# Patient Record
Sex: Male | Born: 1955 | Race: White | Hispanic: No | Marital: Married | Smoking: Never smoker
Health system: Southern US, Community
[De-identification: ages and names within clinical notes are randomized; demographics above are authoritative.]

## PROBLEM LIST (undated history)

## (undated) DIAGNOSIS — K449 Diaphragmatic hernia without obstruction or gangrene: Secondary | ICD-10-CM

## (undated) DIAGNOSIS — C801 Malignant (primary) neoplasm, unspecified: Secondary | ICD-10-CM

## (undated) DIAGNOSIS — G2581 Restless legs syndrome: Secondary | ICD-10-CM

## (undated) DIAGNOSIS — G4700 Insomnia, unspecified: Secondary | ICD-10-CM

## (undated) DIAGNOSIS — K219 Gastro-esophageal reflux disease without esophagitis: Secondary | ICD-10-CM

## (undated) DIAGNOSIS — I10 Essential (primary) hypertension: Secondary | ICD-10-CM

## (undated) DIAGNOSIS — T7840XA Allergy, unspecified, initial encounter: Secondary | ICD-10-CM

## (undated) DIAGNOSIS — E079 Disorder of thyroid, unspecified: Secondary | ICD-10-CM

## (undated) HISTORY — DX: Allergy, unspecified, initial encounter: T78.40XA

## (undated) HISTORY — DX: Essential (primary) hypertension: I10

## (undated) HISTORY — DX: Restless legs syndrome: G25.81

## (undated) HISTORY — DX: Gastro-esophageal reflux disease without esophagitis: K21.9

## (undated) HISTORY — DX: Insomnia, unspecified: G47.00

## (undated) HISTORY — DX: Malignant (primary) neoplasm, unspecified: C80.1

## (undated) HISTORY — PX: OTHER SURGICAL HISTORY: SHX169

## (undated) HISTORY — DX: Diaphragmatic hernia without obstruction or gangrene: K44.9

## (undated) HISTORY — DX: Disorder of thyroid, unspecified: E07.9

---

## 2008-12-10 ENCOUNTER — Ambulatory Visit: Payer: Self-pay | Admitting: Family Medicine

## 2008-12-10 DIAGNOSIS — B181 Chronic viral hepatitis B without delta-agent: Secondary | ICD-10-CM | POA: Insufficient documentation

## 2008-12-10 DIAGNOSIS — I10 Essential (primary) hypertension: Secondary | ICD-10-CM | POA: Insufficient documentation

## 2008-12-10 DIAGNOSIS — E0590 Thyrotoxicosis, unspecified without thyrotoxic crisis or storm: Secondary | ICD-10-CM | POA: Insufficient documentation

## 2008-12-11 ENCOUNTER — Encounter: Payer: Self-pay | Admitting: Family Medicine

## 2008-12-14 LAB — CONVERTED CEMR LAB
AST: 38 units/L — ABNORMAL HIGH (ref 0–37)
Alkaline Phosphatase: 68 units/L (ref 39–117)
BUN: 12 mg/dL (ref 6–23)
Creatinine, Ser: 1.02 mg/dL (ref 0.40–1.50)
Glucose, Bld: 106 mg/dL — ABNORMAL HIGH (ref 70–99)
HCV Ab: NEGATIVE
HDL: 37 mg/dL — ABNORMAL LOW (ref >39)
LDL Cholesterol: 125 mg/dL — ABNORMAL HIGH (ref 0–99)
TSH: 1.385 microintl units/mL (ref 0.350–4.500)
Total Bilirubin: 1.7 mg/dL — ABNORMAL HIGH (ref 0.3–1.2)
Total CHOL/HDL Ratio: 5.1
Triglycerides: 132 mg/dL (ref <150)
VLDL: 26 mg/dL (ref 0–40)

## 2009-02-21 ENCOUNTER — Encounter: Payer: Self-pay | Admitting: Family Medicine

## 2009-02-21 DIAGNOSIS — R7301 Impaired fasting glucose: Secondary | ICD-10-CM | POA: Insufficient documentation

## 2009-02-21 DIAGNOSIS — R748 Abnormal levels of other serum enzymes: Secondary | ICD-10-CM | POA: Insufficient documentation

## 2009-02-22 LAB — CONVERTED CEMR LAB
ALT: 25 units/L (ref 0–53)
AST: 24 units/L (ref 0–37)
Alkaline Phosphatase: 65 units/L (ref 39–117)
Bilirubin, Direct: 0.2 mg/dL (ref 0.0–0.3)
Indirect Bilirubin: 0.9 mg/dL (ref 0.0–0.9)

## 2009-03-11 ENCOUNTER — Ambulatory Visit: Payer: Self-pay | Admitting: Family Medicine

## 2010-04-03 ENCOUNTER — Encounter: Payer: Self-pay | Admitting: Family Medicine

## 2010-04-03 ENCOUNTER — Ambulatory Visit: Payer: Self-pay | Admitting: Family Medicine

## 2010-04-03 DIAGNOSIS — M79609 Pain in unspecified limb: Secondary | ICD-10-CM

## 2010-04-04 DIAGNOSIS — E785 Hyperlipidemia, unspecified: Secondary | ICD-10-CM

## 2010-04-04 LAB — CONVERTED CEMR LAB: Glucose, Bld: 98 mg/dL (ref 70–99)

## 2010-04-06 ENCOUNTER — Encounter: Payer: Self-pay | Admitting: Family Medicine

## 2010-04-07 ENCOUNTER — Telehealth (INDEPENDENT_AMBULATORY_CARE_PROVIDER_SITE_OTHER): Payer: Self-pay | Admitting: *Deleted

## 2010-04-13 ENCOUNTER — Ambulatory Visit: Payer: Self-pay | Admitting: Family Medicine

## 2010-04-13 DIAGNOSIS — B9789 Other viral agents as the cause of diseases classified elsewhere: Secondary | ICD-10-CM | POA: Insufficient documentation

## 2010-05-25 NOTE — Assessment & Plan Note (Signed)
Summary: leg pain/ HTN   Vital Signs:  Patient profile:   55 year old male Height:      74 inches Weight:      233 pounds BMI:     30.02 O2 Sat:      99 % on Room air Pulse rate:   69 / minute BP sitting:   152 / 99  (left arm) Cuff size:   large  Vitals Entered By: [name] CMA (April 03, 2010 10:40 AM)  O2 Flow:  Room air CC: F/U. Med refills. Also c/o achy legs x 1 month   Primary Care Provider:  Seymour Bars DO  CC:  F/U. Med refills. Also c/o achy legs x 1 month.  History of Present Illness: 55 yo WM presents for f/u HTN.  He ran out of atenolol.  He does not have insurance right now.  He has had achey legs and feet x 2 mos.  He had pain at rest this AM and has pain with walking.  He never smoked.  He rarely has numnbess.  His pain has woken him up in the middle of the night.  Usually better with Ibuprofen.  Denies any LBP.  No change in bowel or bladder habits.  Never smoked.      Current Medications (verified): 1)  Atenolol 50 Mg Tabs (Atenolol) .Marland Kitchen.. 1 Tab By Mouth Once Daily 2)  Nasonex 50 Mcg/act Susp (Mometasone Furoate) .... 2 Sprays Per Nostril Daily 3)  Clonazepam 1 Mg Tabs (Clonazepam) .Marland Kitchen.. 1 Tab By Mouth Qhs 4)  Ranitidine Hcl 150 Mg Caps (Ranitidine Hcl) .Marland Kitchen.. 1 Tab By Mouth Bid  Allergies (verified): No Known Drug Allergies  Past History:  Past Medical History: Reviewed history from 12/10/2008 and no changes required. hiatal hernia, gerd insomnia RLS allergies HTN ? 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  Denies ETOH. No exercise.  Review of Systems      See HPI  Physical Exam  General:  alert, well-developed, well-nourished, well-hydrated, and  overweight-appearing.   Head:  normocephalic and atraumatic.   Eyes:  pupils equal, pupils round, and pupils reactive to light.  sclera non icteric Mouth:  pharynx pink and moist and fair dentition.   Neck:  no masses.   Lungs:  Normal respiratory effort, chest expands symmetrically. Lungs are clear to auscultation, no crackles or wheezes. Heart:  Normal rate and regular rhythm. S1 and S2 normal without gallop, murmur, click, rub or other extra sounds.  2+ femoral pulses Abdomen:  soft, normal bowel sounds, no distention, no masses, no hepatomegaly, and no splenomegaly.  RLQ mild guarding, no AA bruits Msk:  negative seated straight leg raise Pulses:  2+ radial pulses with 1+ pedal pulses Extremities:  no LE edema Neurologic:  gait normal.   Skin:  color normal.   Cervical Nodes:  No lymphadenopathy noted   Impression & Recommendations:  Problem # 1:  LEG PAIN, BILATERAL (ICD-729.5) Assessment New Concern for PVD given poor pedal pulses bilat vs less likely lumbar radiculopathy.  Will start with ABIs.  Start ASA 81 mg/ day.  If ABIs are normal, procede with L spine XRAY.   Orders: T-*Unlisted Diagnostic X-ray test/procedure ([address]109)  Problem # 2:  ESSENTIAL HYPERTENSION, BENIGN (ICD-401.1) Assessment: Deteriorated Restart Atenolol daily .  Apparently, he was initially started on this by his previous doctor for hyperthryoidism but was not treated.  Will include TSH in labs. His updated medication list for this problem includes:    Atenolol 50 Mg Tabs (Atenolol) .Marland Kitchen... 1 tab by mouth once daily  BP today: 152/99 Prior BP: 130/90 (03/11/2009)  Labs Reviewed: K+: 4.2 (12/11/2008) Creat: : 1.02 (12/11/2008)   Chol: 188 (12/11/2008)   HDL: 37 (12/11/2008)   LDL: 125 (12/11/2008)   TG: 132 (12/11/2008)  Problem # 3:  IMPAIRED FASTING GLUCOSE (ICD-790.21) Assessment: Unchanged Recheck fasting sugar with labs. Orders: T-Glucose, Blood ([id number])  Complete Medication List: 1)   Atenolol 50 Mg Tabs (Atenolol) .Marland Kitchen.. 1 tab by mouth once daily 2)  Nasonex 50 Mcg/act Susp (Mometasone furoate) .... 2 sprays per nostril daily 3)  Clonazepam 1 Mg Tabs (Clonazepam) .Marland Kitchen.. 1 tab by mouth qhs 4)  Ranitidine Hcl 150 Mg Caps (Ranitidine hcl) .Marland Kitchen.. 1 tab by mouth bid  Other Orders: T-TSH ([id number]) T-Lipoprotein (LDL cholesterol)  ([id number])  Patient Instructions: 1)  Check fasting labs today. 2)  Will call you w/ results tomorrow. 3)  Restart Atenolol once daily. 4)  Take Aspirin 81 mg/ day. 5)  Will get ABIs to screen for peripheral vascular disease. 6)  REturn for follow up in 2 mos. Prescriptions: ATENOLOL 50 MG TABS (ATENOLOL) 1 tab by mouth once daily  #30 x 3   Entered and Authorized by:   Seymour Bars DO   Signed by:   Seymour Bars DO on 04/03/2010   Method used:   Electronically to        [facility] [phone]* (retail)       [address] [place], [place]  [address]       Ph: [phone] or [phone]       Fax: [phone]   RxID:   [id number]    Orders Added: 1)  T-*Unlisted Diagnostic X-ray test/procedure [62831] 2)  T-Glucose, Blood [[id number]] 3)  T-TSH [[id number]] 4)  T-Lipoprotein (LDL cholesterol)  [[id number]] 5)  Est. Patient Level IV [81829]

## 2010-05-25 NOTE — Assessment & Plan Note (Signed)
Summary: viral URI   Vital Signs:  Patient profile:   55 year old male Height:      74 inches Weight:      229 pounds BMI:     29.51 O2 Sat:      97 % on Room air Temp:     98.3 degrees F oral Pulse rate:   78 / minute BP sitting:   133 / 97  (left arm) Cuff size:   large  Vitals Entered By: [name] CMA (April 13, 2010 11:17 AM)  O2 Flow:  Room air CC: ? Sinusitis x 4 days. Head and chest congestion, runny nose, facial pressure   Primary Care Larry Perry:  Larry Bars DO  CC:  ? Sinusitis x 4 days. Head and chest congestion, runny nose, and facial pressure.  History of Present Illness: 55 yo WM presents for head congestion, rhinorrhea, sore throat and laryngitis x 4 days.  He has some cough.  He is taking Mucinex DM, Sudafed and Advil which has helped some.  He has an intermittent cough.  Denies F/C, GI upset.  Denies CP or SOB.  he never smoked.  Denies problems urinating while on cold meds.    Allergies: No Known Drug Allergies  Past History:  Past Medical History: Reviewed history from 12/10/2008 and no changes required. hiatal hernia, gerd insomnia RLS allergies HTN ? 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  Denies ETOH. No exercise.  Review of Systems      See HPI  Physical Exam  General:  alert, well-developed, well-nourished, and well-hydrated.   Head:  normocephalic and atraumatic.  sinuses NTTP Eyes:  conjunctiva clear Ears:  EACs patent; TMs translucent and gray with good cone of light and bony landmarks.  Nose:  nasal congestion present; boggy turbinates Mouth:  o/p injected but no exudates or vesicles hoarse voice Neck:  no masses.   Lungs:  Normal respiratory effort, chest expands symmetrically. Lungs are clear to auscultation, no crackles or wheezes.  dry cough Heart:  Normal rate and regular  rhythm. S1 and S2 normal without gallop, murmur, click, rub or other extra sounds. Skin:  color normal.   Cervical Nodes:  shotty anterior cervical chain LA   Impression & Recommendations:  Problem # 1:  VIRAL INFECTION, ACUTE (ICD-079.99) Day 4 viral URI with laryngitis. Will treat with supportive care measures. If not improving after 7 days, please call.    Complete Medication List: 1)  Atenolol 50 Mg Tabs (Atenolol) .Marland Kitchen.. 1 tab by mouth once daily 2)  Nasonex 50 Mcg/act Susp (Mometasone furoate) .... 2 sprays per nostril daily 3)  Clonazepam 1 Mg Tabs (Clonazepam) .Marland Kitchen.. 1 tab by mouth qhs 4)  Ranitidine Hcl 150 Mg Caps (Ranitidine hcl) .Marland Kitchen.. 1 tab by mouth bid 5)  Simvastatin 40 Mg Tabs (Simvastatin) .Marland Kitchen.. 1 tab by mouth qhs  Patient Instructions: 1)  OK to continue OTC cold meds, just remember to take your BP medicine everyday! 2)  Rest, clear fluids and expect improvements in the next 7 days. 3)  Call if not improved after 7-10 days.   Orders Added: 1)  Est. Patient Level III [16109]

## 2010-05-25 NOTE — Progress Notes (Signed)
Summary: ABIs normal  Phone Note Outgoing Call   Summary of Call: Pls let pt know that his ABIs came back Normal indicating no sign of arterial dz in his legs.  Will procede with L spine xray to look for cause.   Initial call taken by: Seymour Bars DO,  April 07, 2010 1:01 PM  Follow-up for Phone Call        [facility] informing Pt of the above Follow-up by: [name] CMA,  April 07, 2010 1:12 PM

## 2010-06-05 ENCOUNTER — Ambulatory Visit: Payer: Self-pay | Admitting: Family Medicine

## 2010-09-01 ENCOUNTER — Other Ambulatory Visit: Payer: Self-pay | Admitting: Family Medicine

## 2010-10-19 ENCOUNTER — Encounter: Payer: Self-pay | Admitting: Family Medicine

## 2010-10-20 ENCOUNTER — Ambulatory Visit (INDEPENDENT_AMBULATORY_CARE_PROVIDER_SITE_OTHER): Payer: 59 | Admitting: Family Medicine

## 2010-10-20 ENCOUNTER — Encounter: Payer: Self-pay | Admitting: Family Medicine

## 2010-10-20 DIAGNOSIS — E785 Hyperlipidemia, unspecified: Secondary | ICD-10-CM

## 2010-10-20 DIAGNOSIS — G5790 Unspecified mononeuropathy of unspecified lower limb: Secondary | ICD-10-CM

## 2010-10-20 DIAGNOSIS — R7301 Impaired fasting glucose: Secondary | ICD-10-CM

## 2010-10-20 DIAGNOSIS — Z125 Encounter for screening for malignant neoplasm of prostate: Secondary | ICD-10-CM

## 2010-10-20 DIAGNOSIS — I10 Essential (primary) hypertension: Secondary | ICD-10-CM

## 2010-10-20 DIAGNOSIS — M79604 Pain in right leg: Secondary | ICD-10-CM | POA: Insufficient documentation

## 2010-10-20 DIAGNOSIS — G2581 Restless legs syndrome: Secondary | ICD-10-CM

## 2010-10-20 DIAGNOSIS — M79609 Pain in unspecified limb: Secondary | ICD-10-CM

## 2010-10-20 MED ORDER — RAMIPRIL 2.5 MG PO CAPS: 2.5000 mg | ORAL_CAPSULE | Freq: Every day | ORAL | Status: DC

## 2010-10-20 MED ORDER — LORAZEPAM 1 MG PO TABS: ORAL_TABLET | ORAL | Status: DC

## 2010-10-20 NOTE — Assessment & Plan Note (Signed)
BP high even on Atenolol.  Will update labs today and add Ramipril once daily.  RTC for f/u in 2-3 mos to recheck BP and a BMP.  Call if any problems. Work on diet/ exercise.

## 2010-10-20 NOTE — Assessment & Plan Note (Signed)
He is off meds but doesn't think he had any tolerability issues from simvastatin.  Recheck labs today and add back if needed.

## 2010-10-20 NOTE — Progress Notes (Signed)
  Subjective:    Patient ID: Larry Perry, male    DOB: 08/28/1955, 55 y.o.   MRN: [id number]  HPI 55 yo WM presents for f/u visit with fasting labs.  He continues to have pain in both legs, R>L from the knees down.  He has been more anxious lately but not everyday.  He has a lot of stress with his aging parents.  He is taking Atenolol in the evenings for his BP but is not taking Zocor.  Can't remember why he stopped it.  Denies chest pain or DOE.  He does not smoke.  Cell [phone]  BP 135/92  Pulse 61  Ht 6\' 2"  (1.88 m)  Wt 227 lb (102.967 kg)  BMI 29.15 kg/m2  SpO2 95%   Review of Systems  Constitutional: Negative for fatigue and unexpected weight change.  Respiratory: Negative for shortness of breath.   Cardiovascular: Negative for chest pain.  Genitourinary: Negative for difficulty urinating.  Musculoskeletal: Positive for myalgias. Negative for back pain and gait problem.  Psychiatric/Behavioral: Positive for sleep disturbance. Negative for dysphoric mood. The patient is nervous/anxious.        Objective:   Physical Exam  Constitutional: He appears well-developed and well-nourished.       overwt  HENT:  Mouth/Throat: Oropharynx is clear and moist.  Neck: Neck supple.  Cardiovascular: Normal rate, regular rhythm and normal heart sounds.   Pulmonary/Chest: Effort normal and breath sounds normal. No respiratory distress. He has no wheezes.  Musculoskeletal: He exhibits no edema.       Lumbar back: He exhibits normal range of motion, no tenderness and no spasm.  Skin: Skin is warm and dry.  Psychiatric: He has a normal mood and affect.          Assessment & Plan:

## 2010-10-20 NOTE — Assessment & Plan Note (Signed)
bilat leg pain > 1 yr with a neg arterial scan last year.  He is having more of a neuropathy mixed with an RLS or myoclonic leg jerking at night. Denies back pain.  Will check labs today, consider a sleep study and consider MRI L spine.

## 2010-10-20 NOTE — Patient Instructions (Addendum)
Add Rampiril 2.5 mg every morning for high BP.  Stay on Atenolol at night.  Add Lorazepam 1/2 to 1 tab up to 3 x a day (last dose at bedtime) for anxiety and restless legs.  Update labs today. Will call you w/ results on Monday.  May need L spine MRI/ sleep study.

## 2010-10-20 NOTE — Assessment & Plan Note (Signed)
Fasting sugar will be repeated with labs today.

## 2010-10-21 LAB — CBC WITH DIFFERENTIAL/PLATELET
Basophils Absolute: 0.1 10*3/uL (ref 0.0–0.1)
Lymphocytes Relative: 34 % (ref 12–46)
Neutro Abs: 2.7 10*3/uL (ref 1.7–7.7)
Neutrophils Relative %: 49 % (ref 43–77)
Platelets: 256 10*3/uL (ref 150–400)
RBC: 5.1 MIL/uL (ref 4.22–5.81)
RDW: 13.7 % (ref 11.5–15.5)
WBC: 5.5 10*3/uL (ref 4.0–10.5)

## 2010-10-21 LAB — COMPLETE METABOLIC PANEL WITH GFR
ALT: 44 U/L (ref 0–53)
AST: 32 U/L (ref 0–37)
Albumin: 4.1 g/dL (ref 3.5–5.2)
Alkaline Phosphatase: 70 U/L (ref 39–117)
BUN: 19 mg/dL (ref 6–23)
Potassium: 4.5 mEq/L (ref 3.5–5.3)
Sodium: 141 mEq/L (ref 135–145)
Total Protein: 7.4 g/dL (ref 6.0–8.3)

## 2010-10-21 LAB — TSH: TSH: 1.929 u[IU]/mL (ref 0.350–4.500)

## 2010-10-21 LAB — FERRITIN: Ferritin: 208 ng/mL (ref 22–322)

## 2010-10-21 LAB — VITAMIN B12: Vitamin B-12: 701 pg/mL (ref 211–911)

## 2010-10-21 LAB — LIPID PANEL
HDL: 36 mg/dL — ABNORMAL LOW (ref >39)
Total CHOL/HDL Ratio: 5.6 Ratio

## 2010-10-21 LAB — PSA: PSA: 0.73 ng/mL (ref <=4.00)

## 2010-10-21 LAB — FOLATE: Folate: 6.7 ng/mL

## 2010-10-22 ENCOUNTER — Telehealth: Payer: Self-pay | Admitting: Family Medicine

## 2010-10-22 DIAGNOSIS — M79605 Pain in left leg: Secondary | ICD-10-CM

## 2010-10-22 MED ORDER — PRAVASTATIN SODIUM 40 MG PO TABS: 40.0000 mg | ORAL_TABLET | Freq: Every day | ORAL | Status: DC

## 2010-10-22 NOTE — Telephone Encounter (Signed)
His cell # is [phone]

## 2010-10-22 NOTE — Telephone Encounter (Signed)
Pls let pt know that his B12, thyroid function, blood counts and prostate cancer screening marker all came back normal.  Fasting sugar, liver and kindey function are normal.  No sign of iron deficiency.  LDL bad cholesterol is HIGH at 142.  Will start him on Pravastatin 40 mg at bedtime to get him down to a goal of < 130.  Repeat liver function and LDL in 3 mos.

## 2010-10-22 NOTE — Telephone Encounter (Signed)
Will proceed with MRI L spine for chronic bilat pain in legs.

## 2010-10-23 NOTE — Telephone Encounter (Signed)
Pt aware of the above  

## 2010-10-31 ENCOUNTER — Other Ambulatory Visit: Payer: 59

## 2010-11-21 ENCOUNTER — Encounter: Payer: Self-pay | Admitting: Family Medicine

## 2010-11-21 ENCOUNTER — Ambulatory Visit (INDEPENDENT_AMBULATORY_CARE_PROVIDER_SITE_OTHER): Payer: 59 | Admitting: Family Medicine

## 2010-11-21 DIAGNOSIS — I10 Essential (primary) hypertension: Secondary | ICD-10-CM

## 2010-11-21 DIAGNOSIS — F4322 Adjustment disorder with anxiety: Secondary | ICD-10-CM | POA: Insufficient documentation

## 2010-11-21 MED ORDER — ALPRAZOLAM 1 MG PO TABS: 1.0000 mg | ORAL_TABLET | Freq: Two times a day (BID) | ORAL | Status: AC | PRN

## 2010-11-21 MED ORDER — RAMIPRIL 5 MG PO CAPS: 5.0000 mg | ORAL_CAPSULE | Freq: Every day | ORAL | Status: DC

## 2010-11-21 MED ORDER — FLUOXETINE HCL 20 MG PO TABS: 20.0000 mg | ORAL_TABLET | Freq: Every day | ORAL | Status: DC

## 2010-11-21 NOTE — Assessment & Plan Note (Signed)
BP still a little high.  Will go up on Altace to 5 mg/ day.  Call if any problems.  F/u in 2 mos.

## 2010-11-21 NOTE — Assessment & Plan Note (Signed)
Will start him on fluoxetine daily.  benzos have not been effective enough.  Will trade ativan for xanax to use up to 2 x a day.  Call if any problems including worsening mood.

## 2010-11-21 NOTE — Progress Notes (Signed)
  Subjective:    Patient ID: Larry Perry, male    DOB: 11/19/1955, 55 y.o.   MRN: [id number]  HPI  55 yo WM presents for f/u HTN.  He is taking both of his BP meds.  BPs at home running high.  His mom has been sick and he's been feeling anxious and depressed.  He is only on Ativan prn which he is taking everyday.  He doesn't feel like they are helping.  He does not have much family support.  He is taking all of his BP meds.  Working on diet and exercise. Denies CP or DOE.  BP 141/89  Pulse 59  Wt 226 lb (102.513 kg)  SpO2 98%   Review of Systems  Constitutional: Positive for fatigue. Negative for fever.  Eyes: Negative for visual disturbance.  Respiratory: Negative for shortness of breath.   Cardiovascular: Negative for chest pain.  Neurological: Negative for headaches.  Psychiatric/Behavioral: Positive for sleep disturbance and dysphoric mood. The patient is nervous/anxious.        Objective:   Physical Exam  Constitutional: He appears well-developed and well-nourished.  HENT:  Mouth/Throat: Oropharynx is clear and moist.  Neck: No thyromegaly present.  Cardiovascular: Normal rate, regular rhythm and normal heart sounds.   No murmur heard. Pulmonary/Chest: Effort normal and breath sounds normal.  Musculoskeletal: He exhibits no edema.  Psychiatric: He has a normal mood and affect.          Assessment & Plan:   No problem-specific assessment & plan notes found for this encounter.

## 2010-11-21 NOTE — Patient Instructions (Addendum)
Start Fluoxetine 1/2 tab po daily x 7 days then go up to a full tab daily for anxiety and depression.  Increase Altace to 5 mg / day for high BP.  Use Alprazolam up to 2 x a day for anxiety.  Call if any problems.  REturn for f/u mood / BP in 6 wks.

## 2010-12-13 ENCOUNTER — Encounter: Payer: Self-pay | Admitting: Family Medicine

## 2010-12-26 ENCOUNTER — Encounter: Payer: Self-pay | Admitting: Family Medicine

## 2010-12-26 ENCOUNTER — Ambulatory Visit (INDEPENDENT_AMBULATORY_CARE_PROVIDER_SITE_OTHER): Payer: 59 | Admitting: Family Medicine

## 2010-12-26 VITALS — BP 112/78 | HR 56 | Ht 75.0 in | Wt 223.0 lb

## 2010-12-26 DIAGNOSIS — R55 Syncope and collapse: Secondary | ICD-10-CM

## 2010-12-26 DIAGNOSIS — I10 Essential (primary) hypertension: Secondary | ICD-10-CM

## 2010-12-26 DIAGNOSIS — R42 Dizziness and giddiness: Secondary | ICD-10-CM

## 2010-12-27 ENCOUNTER — Telehealth: Payer: Self-pay | Admitting: Family Medicine

## 2010-12-27 ENCOUNTER — Encounter: Payer: Self-pay | Admitting: Family Medicine

## 2010-12-27 DIAGNOSIS — R55 Syncope and collapse: Secondary | ICD-10-CM | POA: Insufficient documentation

## 2010-12-27 LAB — COMPLETE METABOLIC PANEL WITH GFR
Albumin: 4.5 g/dL (ref 3.5–5.2)
Alkaline Phosphatase: 73 U/L (ref 39–117)
BUN: 18 mg/dL (ref 6–23)
CO2: 26 mEq/L (ref 19–32)
Calcium: 9.6 mg/dL (ref 8.4–10.5)
Chloride: 101 mEq/L (ref 96–112)
GFR, Est African American: >60 mL/min (ref >60)
GFR, Est Non African American: >60 mL/min (ref >60)
Glucose, Bld: 116 mg/dL — ABNORMAL HIGH (ref 70–99)
Potassium: 4.3 mEq/L (ref 3.5–5.3)
Total Protein: 7.2 g/dL (ref 6.0–8.3)

## 2010-12-27 NOTE — Telephone Encounter (Signed)
Call   CMP was normal. Will be waiting for CBC.

## 2010-12-27 NOTE — Telephone Encounter (Signed)
LM w/ woman for Pt to CB

## 2010-12-27 NOTE — Progress Notes (Signed)
  Subjective:    Patient ID: Larry Perry, male    DOB: [birth year], 55 y.o.   MRN: [id number]  Loss of Consciousness The current episode started 1 to 4 weeks ago. Episode frequency: once. The problem has been resolved. There was no loss of consciousness (near yncope never did pass out but did receive assistence to his car and his wife drove him home). The symptoms are aggravated by nothing (he had just finish eating w/his wife at a resturant). Associated symptoms include diaphoresis, dizziness, nausea and weakness. Pertinent negatives include no chest pain, slurred speech, visual change or vomiting.      Review of Systems  Constitutional: Positive for diaphoresis.  Cardiovascular: Positive for syncope. Negative for chest pain.  Gastrointestinal: Positive for nausea. Negative for vomiting.  Neurological: Positive for dizziness and weakness.       Objective:   Physical Exam  Constitutional: He is oriented to person, place, and time. He appears well-developed and well-nourished.  HENT:  Head: Normocephalic.  Eyes: Pupils are equal, round, and reactive to light.  Neck: Neck supple.  Cardiovascular: Normal rate, regular rhythm and normal heart sounds.   Pulmonary/Chest: Effort normal and breath sounds normal. No respiratory distress. He has no wheezes.  Musculoskeletal: Normal range of motion.  Neurological: He is alert and oriented to person, place, and time. He has normal reflexes. No cranial nerve deficit. Coordination normal.  Skin: Skin is warm and dry.  Psychiatric: He has a normal mood and affect.          Assessment & Plan:  Near syncopal episode We talked extensively that as long as this was a one time thing extensive work up was probably not needed Did recommend CBC , BMP , and EKG   EKG reviewed and sinus bradycardia present.

## 2011-01-09 ENCOUNTER — Other Ambulatory Visit: Payer: Self-pay | Admitting: Family Medicine

## 2011-01-09 MED ORDER — ALPRAZOLAM 1 MG PO TABS: 1.0000 mg | ORAL_TABLET | Freq: Every day | ORAL | Status: DC

## 2011-01-09 NOTE — Telephone Encounter (Signed)
Refills requested for xanex. Plan:  Refilled # 30 / refills. Jarvis Newcomer, LPN Domingo Dimes

## 2011-01-10 ENCOUNTER — Telehealth: Payer: Self-pay | Admitting: Family Medicine

## 2011-01-10 NOTE — Telephone Encounter (Signed)
Pt's wife called and pt was suppose to have gotten refill on xanex 1mg . Plan:  Reviewed the chart and pt wife informed Rx RF sent yesterday electronically. Jarvis Newcomer, LPN [facility] Dimes

## 2011-02-13 ENCOUNTER — Other Ambulatory Visit: Payer: Self-pay | Admitting: *Deleted

## 2011-02-13 MED ORDER — ALPRAZOLAM 1 MG PO TABS: 1.0000 mg | ORAL_TABLET | Freq: Every day | ORAL | Status: DC

## 2011-02-26 ENCOUNTER — Other Ambulatory Visit: Payer: Self-pay | Admitting: *Deleted

## 2011-02-26 MED ORDER — ATENOLOL 50 MG PO TABS: 50.0000 mg | ORAL_TABLET | Freq: Every day | ORAL | Status: DC

## 2011-03-22 ENCOUNTER — Other Ambulatory Visit: Payer: Self-pay | Admitting: *Deleted

## 2011-03-22 MED ORDER — ALPRAZOLAM 1 MG PO TABS: 1.0000 mg | ORAL_TABLET | Freq: Every day | ORAL | Status: DC

## 2011-04-23 ENCOUNTER — Other Ambulatory Visit: Payer: Self-pay | Admitting: *Deleted

## 2011-04-23 MED ORDER — ALPRAZOLAM 1 MG PO TABS: 1.0000 mg | ORAL_TABLET | Freq: Every day | ORAL | Status: DC

## 2011-05-01 ENCOUNTER — Encounter: Payer: Self-pay | Admitting: Family Medicine

## 2011-05-01 ENCOUNTER — Ambulatory Visit (INDEPENDENT_AMBULATORY_CARE_PROVIDER_SITE_OTHER): Payer: 59 | Admitting: Family Medicine

## 2011-05-01 ENCOUNTER — Other Ambulatory Visit: Payer: Self-pay | Admitting: Family Medicine

## 2011-05-01 ENCOUNTER — Ambulatory Visit: Payer: 59 | Admitting: Family Medicine

## 2011-05-01 ENCOUNTER — Telehealth: Payer: Self-pay | Admitting: Family Medicine

## 2011-05-01 DIAGNOSIS — J0100 Acute maxillary sinusitis, unspecified: Secondary | ICD-10-CM

## 2011-05-01 DIAGNOSIS — R0981 Nasal congestion: Secondary | ICD-10-CM

## 2011-05-01 DIAGNOSIS — R7301 Impaired fasting glucose: Secondary | ICD-10-CM

## 2011-05-01 DIAGNOSIS — J3489 Other specified disorders of nose and nasal sinuses: Secondary | ICD-10-CM

## 2011-05-01 DIAGNOSIS — I10 Essential (primary) hypertension: Secondary | ICD-10-CM

## 2011-05-01 MED ORDER — CETIRIZINE-PSEUDOEPHEDRINE ER 5-120 MG PO TB12: 1.0000 | ORAL_TABLET | Freq: Two times a day (BID) | ORAL | Status: AC

## 2011-05-01 MED ORDER — AMOXICILLIN-POT CLAVULANATE 875-125 MG PO TABS: 1.0000 | ORAL_TABLET | Freq: Two times a day (BID) | ORAL | Status: DC

## 2011-05-01 NOTE — Progress Notes (Signed)
Subjective:     Patient ID: Larry Perry, male   DOB: 04-Mar-1956, 56 y.o.   MRN: [id number]  HPI #1Patient's here for followup of his hypertension #2 he denies any more near syncope episodes but reports having trouble with congestion for now weeks to months states sometimes he feels thick phlegm going down back of his throat making him gag. He denies fever states he's been taking Zyrtec and that while it seemed to help initially it no longer helps him.    Review of Systems Essentially unremarkable He does report having some pain in his foot and chronic ibuprofen once every day to   BP 121/82  Pulse 59  Ht 6\' 3"  (1.905 m)  Wt 217 lb (98.431 kg)  BMI 27.12 kg/m2  SpO2 98% Objective:   Physical Exam  well-nourished well-developed white male in no acute distress Lungs were clear to card to auscultation percussion cardiovascular S1-S2 regular rhythm Both TMs are clear oral mucosa unremarkable nasal turbinates were swollen and mild tenderness over maxillary sinuses. Lab is reviewed and most the time when patient had blood sugars checked they were slightly elevated also his bilirubin when that was checked was also found to be elevated but he does have a history of previous hepatitis B. He was unaware of hepatitis B but his labs indicate that he had a history of hepatitis B before.    . Assessment:    #1 hypertension under good control #2 elevated bilirubin probably secondary to old hepatitis B infection #3 blood sugars elevation with a normal A1c of 5.2 essentially ruling out neuropathy being the cause of the foot pain from diabetes #4 history what sounds like a sinus infection chronic #5 immunizations records reviewed and updated    Plan:     #4 Augmentin 875 twice a day Zyrtec-D twice a day Complete physical in 4 months

## 2011-05-01 NOTE — Telephone Encounter (Signed)
Patient is scheduled for a cpe on may 7th and would like to get lab order sent down by end of April.Larry Perry

## 2011-05-01 NOTE — Patient Instructions (Addendum)
Sinusitis Sinuses are air pockets within the bones of your face. The growth of bacteria within a sinus leads to infection. The infection prevents the sinuses from draining. This infection is called sinusitis. SYMPTOMS  There will be different areas of pain depending on which sinuses have become infected.  The maxillary sinuses often produce pain beneath the eyes.   Frontal sinusitis may cause pain in the middle of the forehead and above the eyes.  Other problems (symptoms) include:  Toothaches.   Colored, pus-like (purulent) drainage from the nose.   Swelling, warmth, and tenderness over the sinus areas may be signs of infection.  TREATMENT  Sinusitis is most often determined by an exam.X-rays may be taken. If x-rays have been taken, make sure you obtain your results or find out how you are to obtain them. Your caregiver may give you medications (antibiotics). These are medications that will help kill the bacteria causing the infection. You may also be given a medication (decongestant) that helps to reduce sinus swelling.  HOME CARE INSTRUCTIONS   Only take over-the-counter or prescription medicines for pain, discomfort, or fever as directed by your caregiver.   Drink extra fluids. Fluids help thin the mucus so your sinuses can drain more easily.   Applying either moist heat or ice packs to the sinus areas may help relieve discomfort.   Use saline nasal sprays to help moisten your sinuses. The sprays can be found at your local drugstore.  SEEK IMMEDIATE MEDICAL CARE IF:  You have a fever.   You have increasing pain, severe headaches, or toothache.   You have nausea, vomiting, or drowsiness.   You develop unusual swelling around the face or trouble seeing.  MAKE SURE YOU:   Understand these instructions.   Will watch your condition.   Will get help right away if you are not doing well or get worse.  Document Released: 04/09/2005 Document Revised: 12/20/2010 Document Reviewed:  11/06/2006 [facility] Patient Information 2012 [employer], [employer]

## 2011-05-11 ENCOUNTER — Other Ambulatory Visit: Payer: Self-pay | Admitting: Family Medicine

## 2011-06-05 ENCOUNTER — Other Ambulatory Visit: Payer: Self-pay | Admitting: *Deleted

## 2011-06-05 MED ORDER — ALPRAZOLAM 1 MG PO TABS: 1.0000 mg | ORAL_TABLET | Freq: Every day | ORAL | Status: DC

## 2011-07-24 ENCOUNTER — Other Ambulatory Visit: Payer: Self-pay | Admitting: *Deleted

## 2011-07-24 MED ORDER — ALPRAZOLAM 1 MG PO TABS: 1.0000 mg | ORAL_TABLET | Freq: Every day | ORAL | Status: DC

## 2011-08-28 ENCOUNTER — Encounter: Payer: 59 | Admitting: Family Medicine

## 2011-08-29 ENCOUNTER — Encounter: Payer: Self-pay | Admitting: Physician Assistant

## 2011-08-29 ENCOUNTER — Ambulatory Visit (INDEPENDENT_AMBULATORY_CARE_PROVIDER_SITE_OTHER): Payer: Self-pay | Admitting: Physician Assistant

## 2011-08-29 VITALS — BP 137/85 | HR 70 | Ht 75.0 in | Wt 216.0 lb

## 2011-08-29 DIAGNOSIS — Z0000 Encounter for general adult medical examination without abnormal findings: Secondary | ICD-10-CM

## 2011-08-29 DIAGNOSIS — Z1211 Encounter for screening for malignant neoplasm of colon: Secondary | ICD-10-CM

## 2011-08-29 DIAGNOSIS — Z86006 Personal history of melanoma in-situ: Secondary | ICD-10-CM

## 2011-08-29 DIAGNOSIS — Z23 Encounter for immunization: Secondary | ICD-10-CM

## 2011-08-29 DIAGNOSIS — E785 Hyperlipidemia, unspecified: Secondary | ICD-10-CM

## 2011-08-29 MED ORDER — FLUOXETINE HCL 20 MG PO CAPS: 20.0000 mg | ORAL_CAPSULE | Freq: Every day | ORAL | Status: DC

## 2011-08-29 MED ORDER — FLUTICASONE PROPIONATE 50 MCG/ACT NA SUSP: 2.0000 | Freq: Every day | NASAL | Status: AC

## 2011-08-29 MED ORDER — ATENOLOL 50 MG PO TABS: 50.0000 mg | ORAL_TABLET | Freq: Every day | ORAL | Status: DC

## 2011-08-29 MED ORDER — RAMIPRIL 5 MG PO CAPS: 5.0000 mg | ORAL_CAPSULE | Freq: Every day | ORAL | Status: DC

## 2011-08-29 NOTE — Patient Instructions (Addendum)
Continue taking Asprin daily. Make sure to try and get Calcium in 4 servings of daily a day. Will call with lab results. Continue taking Zyrtec D for allergies. Will give flonase today for nasal congestion and to help with allergies. Consider an allergy referral to consider allergy shots since having problems during this time of year. Follow up in 6 months for blood pressure. Will refer for colonoscopy. Tdap given today. Will refer to dermatology due to hx of melanoma.

## 2011-08-29 NOTE — Progress Notes (Signed)
  Subjective:    Patient ID: Larry Perry, male    DOB: 1955/07/28, 56 y.o.   MRN: [id number]  HPI Patient comes in for CPE today. He is feeling great today. He does want a dermatology referral due to hx of melanoma and has a suspicious mole on his left side that he wants looked at. His allergies have been ok as long as he stays on zyrtec. He stills has a lot of watery eyes and drainage.   Review of Systems     Objective:   Physical Exam  Constitutional: He is oriented to person, place, and time. He appears well-developed and well-nourished.  HENT:  Head: Normocephalic and atraumatic.  Right Ear: External ear normal.  Left Ear: External ear normal.  Mouth/Throat: Oropharynx is clear and moist. No oropharyngeal exudate.       TM's normal.Turbinates red and swollen.  Eyes: Conjunctivae and EOM are normal. Pupils are equal, round, and reactive to light.  Neck: Normal range of motion. Neck supple. No JVD present. No thyromegaly present.       No bruits.  Cardiovascular: Normal rate, regular rhythm, normal heart sounds and intact distal pulses.   Pulmonary/Chest: Effort normal and breath sounds normal. He has no wheezes.  Abdominal: Soft. Bowel sounds are normal. He exhibits no distension and no mass. There is no tenderness. There is no rebound and no guarding.  Genitourinary: Rectum normal, prostate normal and penis normal. Guaiac negative stool.  Musculoskeletal: Normal range of motion.  Lymphadenopathy:    He has no cervical adenopathy.  Neurological: He is alert and oriented to person, place, and time. He has normal reflexes. No cranial nerve deficit.  Skin: Skin is warm and dry.       Multiple moles all over body. Brown scaly papule risen with wart like appearance on left side.  2 well healed scars about 2 inches on left side of back.  Psychiatric: He has a normal mood and affect. His behavior is normal.          Assessment & Plan:  CPE/Hx of melanoma/Seasonal  allergies/seborheic keratosis-Continue taking Asprin daily. Make sure to try and get Calcium in 4 servings of daily a day. Will call with lab results. Continue taking Zyrtec D for allergies. Will give flonase today for nasal congestion and to help with allergies. Consider an allergy referral to consider allergy shots since having problems during this time of year. Follow up in 6 months for blood pressure. Will refer for colonoscopy. Tdap given today. Will refer to dermatology due to hx of melanoma and removal of SK. Will call with labs.

## 2011-08-30 LAB — COMPLETE METABOLIC PANEL WITH GFR
AST: 21 U/L (ref 0–37)
Albumin: 4.4 g/dL (ref 3.5–5.2)
BUN: 9 mg/dL (ref 6–23)
Calcium: 9.4 mg/dL (ref 8.4–10.5)
Chloride: 106 mEq/L (ref 96–112)
GFR, Est Non African American: 69 mL/min
Glucose, Bld: 92 mg/dL (ref 70–99)
Potassium: 5 mEq/L (ref 3.5–5.3)
Sodium: 144 mEq/L (ref 135–145)
Total Protein: 6.9 g/dL (ref 6.0–8.3)

## 2011-08-30 LAB — LIPID PANEL: Cholesterol: 150 mg/dL (ref 0–200)

## 2011-09-06 ENCOUNTER — Telehealth: Payer: Self-pay | Admitting: *Deleted

## 2011-09-06 DIAGNOSIS — J0100 Acute maxillary sinusitis, unspecified: Secondary | ICD-10-CM

## 2011-09-06 DIAGNOSIS — R0981 Nasal congestion: Secondary | ICD-10-CM

## 2011-09-06 MED ORDER — AMOXICILLIN-POT CLAVULANATE 875-125 MG PO TABS: 1.0000 | ORAL_TABLET | Freq: Two times a day (BID) | ORAL | Status: AC

## 2011-09-06 NOTE — Telephone Encounter (Signed)
Pt states for a week now he has been having problems with allergies. States he has had the runny nose, cough,congestion and drainage. States he has been taking Zyrtec, Delsym and Sudafed. States he doesn't have insurance at this time so he would like to know if you will call him in something to help. States you called him in an abx last time. Please advise.

## 2011-09-06 NOTE — Telephone Encounter (Signed)
Pt informed

## 2011-09-06 NOTE — Telephone Encounter (Signed)
I guess that is why he did not come back for that PE. Okay Augmentin 875 #20 one by mouth twice a day if he has recurrence or needs more or if this does not work he'll need to be seen. We'll put the order in.

## 2011-09-07 ENCOUNTER — Other Ambulatory Visit: Payer: Self-pay | Admitting: *Deleted

## 2011-09-07 MED ORDER — ALPRAZOLAM 1 MG PO TABS: 1.0000 mg | ORAL_TABLET | Freq: Every day | ORAL | Status: DC

## 2011-09-30 ENCOUNTER — Other Ambulatory Visit: Payer: Self-pay | Admitting: Family Medicine

## 2011-10-22 ENCOUNTER — Other Ambulatory Visit: Payer: Self-pay | Admitting: *Deleted

## 2011-10-22 MED ORDER — ALPRAZOLAM 1 MG PO TABS: 1.0000 mg | ORAL_TABLET | Freq: Every day | ORAL | Status: DC

## 2011-12-06 ENCOUNTER — Other Ambulatory Visit: Payer: Self-pay | Admitting: Family Medicine

## 2011-12-07 ENCOUNTER — Other Ambulatory Visit: Payer: Self-pay

## 2011-12-07 NOTE — Telephone Encounter (Signed)
Faxed prescription for alprazolam 1 mg daily was not sent due to the already authorized prescription in [facility].

## 2011-12-25 ENCOUNTER — Other Ambulatory Visit: Payer: Self-pay | Admitting: *Deleted

## 2011-12-25 MED ORDER — ALPRAZOLAM 1 MG PO TABS: 1.0000 mg | ORAL_TABLET | Freq: Every day | ORAL | Status: DC

## 2012-01-25 ENCOUNTER — Other Ambulatory Visit: Payer: Self-pay | Admitting: *Deleted

## 2012-01-25 MED ORDER — PRAVASTATIN SODIUM 40 MG PO TABS: 40.0000 mg | ORAL_TABLET | Freq: Every day | ORAL | Status: DC

## 2012-02-29 ENCOUNTER — Ambulatory Visit (INDEPENDENT_AMBULATORY_CARE_PROVIDER_SITE_OTHER): Payer: PRIVATE HEALTH INSURANCE | Admitting: Physician Assistant

## 2012-02-29 ENCOUNTER — Encounter: Payer: Self-pay | Admitting: Physician Assistant

## 2012-02-29 VITALS — BP 108/76 | HR 64 | Ht 75.0 in | Wt 216.0 lb

## 2012-02-29 DIAGNOSIS — G589 Mononeuropathy, unspecified: Secondary | ICD-10-CM

## 2012-02-29 DIAGNOSIS — G629 Polyneuropathy, unspecified: Secondary | ICD-10-CM

## 2012-02-29 DIAGNOSIS — G2581 Restless legs syndrome: Secondary | ICD-10-CM

## 2012-02-29 DIAGNOSIS — Z23 Encounter for immunization: Secondary | ICD-10-CM

## 2012-02-29 DIAGNOSIS — I10 Essential (primary) hypertension: Secondary | ICD-10-CM

## 2012-02-29 MED ORDER — GABAPENTIN 300 MG PO CAPS: 300.0000 mg | ORAL_CAPSULE | Freq: Every day | ORAL | Status: DC

## 2012-02-29 NOTE — Patient Instructions (Addendum)
Try neurontin at bedtime for next month. Call if not improving.   Reminded patient to make colonoscopy appt.

## 2012-02-29 NOTE — Progress Notes (Signed)
  Subjective:    Patient ID: Larry Perry, male    DOB: 21-Sep-1955, 56 y.o.   MRN: [id number]  HPI Patient is a 56 year old male who presents to the clinic to follow-up on hypertension. At last visit his blood pressure was elevated. He comes in today to make sure this elevation has not persisted. Today his BP is great. He denies any chest pains, palpitations, shortness of breath. Patient has tried to keep a low salt diet and stay active. He does report that he has had some bilateral leg numbness and tingling for the last 6 months. He also has had some occasional muscle cramps in his lower calf bilaterally. He denies any numbness and tingling in his toes. He does have a history of impaired fasting glucose but has been evaluated and has not progressed to diabetes. He denies any trauma to his back or legs recently. He has been on Pravachol for over one year and never had any side effects of muscle aches or pain with protocol. He has not started any new medications recently. His thyroid and B12 have been recently checked and were normal. He has not tried anything to make better.    Review of Systems     Objective:   Physical Exam  Constitutional: He is oriented to person, place, and time. He appears well-developed and well-nourished.  HENT:  Head: Normocephalic and atraumatic.  Cardiovascular: Normal rate, regular rhythm, normal heart sounds and intact distal pulses.   Pulmonary/Chest: Effort normal and breath sounds normal.  Musculoskeletal:       Inspection of bilateral legs were normal with no signs of swelling or color changes. Pedal pulses were 2+ bilaterally. Knee reflexes were 2+ bilaterally. There is no tenderness to palpation of bilateral calf. Sensation to touch was normal bilaterally.  Neurological: He is alert and oriented to person, place, and time.  Skin: Skin is warm and dry.  Psychiatric: He has a normal mood and affect. His behavior is normal.          Assessment & Plan:    Hypertension-blood pressure looks great today will continue on current therapy we'll recheck in 3 months. Patient was encouraged to keep low-salt diet and regular exercise.  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 Felt the patient may have some restless leg at night this should also help with that. If medication is not improving symptoms at all patient was instructed to stop Pravachol for one month to see if that helped with symptoms. If he still has no relief then follow-up in office.  Flu shot was given today.  Patient has not gotten colonoscopy yet. Patient was encouraged to get this done. I did give patient a just this helps number to call and make appointment.

## 2012-03-18 ENCOUNTER — Other Ambulatory Visit: Payer: Self-pay | Admitting: Family Medicine

## 2012-04-21 ENCOUNTER — Other Ambulatory Visit: Payer: Self-pay | Admitting: Physician Assistant

## 2012-04-21 ENCOUNTER — Other Ambulatory Visit: Payer: Self-pay | Admitting: Family Medicine

## 2012-05-13 ENCOUNTER — Encounter: Payer: Self-pay | Admitting: Family Medicine

## 2012-05-13 ENCOUNTER — Ambulatory Visit (INDEPENDENT_AMBULATORY_CARE_PROVIDER_SITE_OTHER): Payer: Self-pay | Admitting: Family Medicine

## 2012-05-13 VITALS — BP 127/80 | HR 118 | Temp 98.0°F | Resp 18 | Wt 214.0 lb

## 2012-05-13 DIAGNOSIS — I10 Essential (primary) hypertension: Secondary | ICD-10-CM

## 2012-05-13 DIAGNOSIS — J40 Bronchitis, not specified as acute or chronic: Secondary | ICD-10-CM

## 2012-05-13 DIAGNOSIS — J329 Chronic sinusitis, unspecified: Secondary | ICD-10-CM

## 2012-05-13 MED ORDER — AMOXICILLIN-POT CLAVULANATE 875-125 MG PO TABS: 1.0000 | ORAL_TABLET | Freq: Two times a day (BID) | ORAL | Status: DC

## 2012-05-13 NOTE — Progress Notes (Signed)
  Subjective:    Patient ID: Larry Perry, male    DOB: Mar 13, 1956, 57 y.o.   MRN: [id number]  HPI Dry cough x 1 month- He has tried OTC Sudafed and Delsym with no relief. He complains of chills and sweats off and on. Sinus congestion x 1 mo.  No fever.  Feels like getting a tightness in her chest.  Some SOB when coughing.  Stomach hurts when takes a deep breth.  Bilateral sinus pressure.  Some HA with it.  No GI sxs.  Hx of Asthma.  Hx of hiatal hernia.    HTN- Pt denies chest pain, SOB, dizziness, or heart palpitations.  Taking meds as directed w/o problems.  Denies medication side effects.      Review of Systems     Objective:   Physical Exam  Constitutional: He is oriented to person, place, and time. He appears well-developed and well-nourished.  HENT:  Head: Normocephalic and atraumatic.  Right Ear: External ear normal.  Left Ear: External ear normal.  Nose: Nose normal.  Mouth/Throat: Oropharynx is clear and moist.       TMs and canals are clear.   Eyes: Conjunctivae normal and EOM are normal. Pupils are equal, round, and reactive to light.  Neck: Neck supple. No thyromegaly present.  Cardiovascular: Normal rate and normal heart sounds.   Pulmonary/Chest: Effort normal and breath sounds normal.  Lymphadenopathy:    He has no cervical adenopathy.  Neurological: He is alert and oriented to person, place, and time.  Skin: Skin is warm and dry.  Psychiatric: He has a normal mood and affect.          Assessment & Plan:  Sinsusitis/Bronchitis - will tx with augmentin. Call if not getting better before the weekend. Call if fever or gets worse.  If not improving then will get CXR.   HTN - well controlled. F/U in 3 months.

## 2012-05-13 NOTE — Patient Instructions (Signed)
Sinusitis Sinusitis is redness, soreness, and swelling (inflammation) of the paranasal sinuses. Paranasal sinuses are air pockets within the bones of your face (beneath the eyes, the middle of the forehead, or above the eyes). In healthy paranasal sinuses, mucus is able to drain out, and air is able to circulate through them by way of your nose. However, when your paranasal sinuses are inflamed, mucus and air can become trapped. This can allow bacteria and other germs to grow and cause infection. Sinusitis can develop quickly and last only a short time (acute) or continue over a long period (chronic). Sinusitis that lasts for more than 12 weeks is considered chronic.  CAUSES  Causes of sinusitis include:  Allergies.  Structural abnormalities, such as displacement of the cartilage that separates your nostrils (deviated septum), which can decrease the air flow through your nose and sinuses and affect sinus drainage.  Functional abnormalities, such as when the small hairs (cilia) that line your sinuses and help remove mucus do not work properly or are not present. SYMPTOMS  Symptoms of acute and chronic sinusitis are the same. The primary symptoms are pain and pressure around the affected sinuses. Other symptoms include:  Upper toothache.  Earache.  Headache.  Bad breath.  Decreased sense of smell and taste.  A cough, which worsens when you are lying flat.  Fatigue.  Fever.  Thick drainage from your nose, which often is green and may contain pus (purulent).  Swelling and warmth over the affected sinuses. DIAGNOSIS  Your caregiver will perform a physical exam. During the exam, your caregiver may:  Look in your nose for signs of abnormal growths in your nostrils (nasal polyps).  Tap over the affected sinus to check for signs of infection.  View the inside of your sinuses (endoscopy) with a special imaging device with a light attached (endoscope), which is inserted into your  sinuses. If your caregiver suspects that you have chronic sinusitis, one or more of the following tests may be recommended:  Allergy tests.  Nasal culture A sample of mucus is taken from your nose and sent to a lab and screened for bacteria.  Nasal cytology A sample of mucus is taken from your nose and examined by your caregiver to determine if your sinusitis is related to an allergy. TREATMENT  Most cases of acute sinusitis are related to a viral infection and will resolve on their own within 10 days. Sometimes medicines are prescribed to help relieve symptoms (pain medicine, decongestants, nasal steroid sprays, or saline sprays).  However, for sinusitis related to a bacterial infection, your caregiver will prescribe antibiotic medicines. These are medicines that will help kill the bacteria causing the infection.  Rarely, sinusitis is caused by a fungal infection. In theses cases, your caregiver will prescribe antifungal medicine. For some cases of chronic sinusitis, surgery is needed. Generally, these are cases in which sinusitis recurs more than 3 times per year, despite other treatments. HOME CARE INSTRUCTIONS   Drink plenty of water. Water helps thin the mucus so your sinuses can drain more easily.  Use a humidifier.  Inhale steam 3 to 4 times a day (for example, sit in the bathroom with the shower running).  Apply a warm, moist washcloth to your face 3 to 4 times a day, or as directed by your caregiver.  Use saline nasal sprays to help moisten and clean your sinuses.  Take over-the-counter or prescription medicines for pain, discomfort, or fever only as directed by your caregiver. SEEK IMMEDIATE MEDICAL   CARE IF:  You have increasing pain or severe headaches.  You have nausea, vomiting, or drowsiness.  You have swelling around your face.  You have vision problems.  You have a stiff neck.  You have difficulty breathing. MAKE SURE YOU:   Understand these  instructions.  Will watch your condition.  Will get help right away if you are not doing well or get worse. Document Released: 04/09/2005 Document Revised: 07/02/2011 Document Reviewed: 04/24/2011 Exit[employer] Patient Information 2013 [employer]. Acute Bronchitis You have acute bronchitis. This means you have a chest cold. The airways in your lungs are red and sore (inflamed). Acute means it is sudden onset.  CAUSES Bronchitis is most often caused by the same virus that causes a cold. SYMPTOMS   Body aches.  Chest congestion.  Chills.  Cough.  Fever.  Shortness of breath.  Sore throat. TREATMENT  Acute bronchitis is usually treated with rest, fluids, and medicines for relief of fever or cough. Most symptoms should go away after a few days or a week. Increased fluids may help thin your secretions and will prevent dehydration. Your caregiver may give you an inhaler to improve your symptoms. The inhaler reduces shortness of breath and helps control cough. You can take over-the-counter pain relievers or cough medicine to decrease coughing, pain, or fever. A cool-air vaporizer may help thin bronchial secretions and make it easier to clear your chest. Antibiotics are usually not needed but can be prescribed if you smoke, are seriously ill, have chronic lung problems, are elderly, or you are at higher risk for developing complications.Allergies and asthma can make bronchitis worse. Repeated episodes of bronchitis may cause longstanding lung problems. Avoid smoking and secondhand smoke.Exposure to cigarette smoke or irritating chemicals will make bronchitis worse. If you are a cigarette smoker, consider using nicotine gum or skin patches to help control withdrawal symptoms. Quitting smoking will help your lungs heal faster. Recovery from bronchitis is often slow, but you should start feeling better after 2 to 3 days. Cough from bronchitis frequently lasts for 3 to 4 weeks. To prevent  another bout of acute bronchitis:  Quit smoking.  Wash your hands frequently to get rid of viruses or use a hand sanitizer.  Avoid other people with cold or virus symptoms.  Try not to touch your hands to your mouth, nose, or eyes. SEEK IMMEDIATE MEDICAL CARE IF:  You develop increased fever, chills, or chest pain.  You have severe shortness of breath or bloody sputum.  You develop dehydration, fainting, repeated vomiting, or a severe headache.  You have no improvement after 1 week of treatment or you get worse. MAKE SURE YOU:   Understand these instructions.  Will watch your condition.  Will get help right away if you are not doing well or get worse. Document Released: 05/17/2004 Document Revised: 07/02/2011 Document Reviewed: 08/02/2010 ExitCare Patient Information 2013 [employer].  

## 2012-05-23 ENCOUNTER — Other Ambulatory Visit: Payer: Self-pay | Admitting: Physician Assistant

## 2012-06-02 ENCOUNTER — Ambulatory Visit: Payer: PRIVATE HEALTH INSURANCE | Admitting: Family Medicine

## 2012-06-19 ENCOUNTER — Other Ambulatory Visit: Payer: Self-pay | Admitting: Physician Assistant

## 2012-06-20 ENCOUNTER — Other Ambulatory Visit: Payer: Self-pay | Admitting: *Deleted

## 2012-06-20 MED ORDER — ALPRAZOLAM 1 MG PO TABS: ORAL_TABLET | ORAL | Status: DC

## 2012-07-01 ENCOUNTER — Ambulatory Visit: Payer: Self-pay | Admitting: Family Medicine

## 2012-07-01 DIAGNOSIS — Z0289 Encounter for other administrative examinations: Secondary | ICD-10-CM

## 2012-07-03 ENCOUNTER — Telehealth: Payer: Self-pay

## 2012-07-03 MED ORDER — SIMVASTATIN 40 MG PO TABS: 40.0000 mg | ORAL_TABLET | Freq: Every evening | ORAL | Status: DC

## 2012-07-03 NOTE — Telephone Encounter (Signed)
Left detailed message.   

## 2012-07-03 NOTE — Telephone Encounter (Signed)
Pravastatin is on the 4$ list at [facility] and should be cheaper but I will send over simvastatin.

## 2012-07-03 NOTE — Telephone Encounter (Signed)
Larry Perry wants to switch his cholesterol medication from Pravachol to simvastatin due to the cost.

## 2012-07-04 ENCOUNTER — Other Ambulatory Visit: Payer: Self-pay | Admitting: *Deleted

## 2012-07-04 MED ORDER — SIMVASTATIN 40 MG PO TABS: 40.0000 mg | ORAL_TABLET | Freq: Every evening | ORAL | Status: AC

## 2012-07-31 ENCOUNTER — Other Ambulatory Visit: Payer: Self-pay | Admitting: Family Medicine

## 2012-08-06 ENCOUNTER — Other Ambulatory Visit: Payer: Self-pay | Admitting: Physician Assistant

## 2012-09-18 ENCOUNTER — Other Ambulatory Visit: Payer: Self-pay | Admitting: Physician Assistant

## 2012-09-23 ENCOUNTER — Other Ambulatory Visit: Payer: Self-pay | Admitting: Physician Assistant

## 2012-09-24 ENCOUNTER — Other Ambulatory Visit: Payer: Self-pay | Admitting: *Deleted

## 2012-09-24 MED ORDER — ALPRAZOLAM 1 MG PO TABS: ORAL_TABLET | ORAL | Status: DC

## 2012-09-29 ENCOUNTER — Other Ambulatory Visit: Payer: Self-pay | Admitting: Physician Assistant

## 2013-03-21 ENCOUNTER — Other Ambulatory Visit: Payer: Self-pay | Admitting: Family Medicine

## 2013-03-23 ENCOUNTER — Telehealth: Payer: Self-pay | Admitting: *Deleted

## 2013-03-23 NOTE — Telephone Encounter (Signed)
LMOM informing pt he needs to schedule appt and that we are only sending 2 weeks of Altace to pharmacy.  Meyer Cory, LPN'

## 2013-03-25 ENCOUNTER — Other Ambulatory Visit: Payer: Self-pay | Admitting: Family Medicine

## 2013-03-25 MED ORDER — ALPRAZOLAM 1 MG PO TABS: ORAL_TABLET | ORAL | Status: DC

## 2014-12-06 ENCOUNTER — Emergency Department (HOSPITAL_COMMUNITY): Payer: Self-pay

## 2014-12-06 ENCOUNTER — Emergency Department (HOSPITAL_COMMUNITY)
Admission: EM | Admit: 2014-12-06 | Discharge: 2014-12-06 | Disposition: A | Discharge status: Home / Self Care | Payer: Self-pay | Attending: Emergency Medicine | Admitting: Emergency Medicine

## 2014-12-06 ENCOUNTER — Encounter (HOSPITAL_COMMUNITY): Payer: Self-pay | Admitting: Emergency Medicine

## 2014-12-06 DIAGNOSIS — I10 Essential (primary) hypertension: Secondary | ICD-10-CM | POA: Insufficient documentation

## 2014-12-06 DIAGNOSIS — G2581 Restless legs syndrome: Secondary | ICD-10-CM | POA: Insufficient documentation

## 2014-12-06 DIAGNOSIS — Z79899 Other long term (current) drug therapy: Secondary | ICD-10-CM | POA: Insufficient documentation

## 2014-12-06 DIAGNOSIS — K219 Gastro-esophageal reflux disease without esophagitis: Secondary | ICD-10-CM | POA: Insufficient documentation

## 2014-12-06 DIAGNOSIS — Z7951 Long term (current) use of inhaled steroids: Secondary | ICD-10-CM | POA: Insufficient documentation

## 2014-12-06 DIAGNOSIS — Z8639 Personal history of other endocrine, nutritional and metabolic disease: Secondary | ICD-10-CM | POA: Insufficient documentation

## 2014-12-06 DIAGNOSIS — G4700 Insomnia, unspecified: Secondary | ICD-10-CM | POA: Insufficient documentation

## 2014-12-06 DIAGNOSIS — Z85820 Personal history of malignant melanoma of skin: Secondary | ICD-10-CM | POA: Insufficient documentation

## 2014-12-06 DIAGNOSIS — R569 Unspecified convulsions: Secondary | ICD-10-CM | POA: Insufficient documentation

## 2014-12-06 LAB — URINALYSIS, ROUTINE W REFLEX MICROSCOPIC
BILIRUBIN URINE: NEGATIVE
GLUCOSE, UA: NEGATIVE mg/dL
Hgb urine dipstick: NEGATIVE
Ketones, ur: NEGATIVE mg/dL
Leukocytes, UA: NEGATIVE
NITRITE: NEGATIVE
PH: 6 (ref 5.0–8.0)
Protein, ur: NEGATIVE mg/dL
SPECIFIC GRAVITY, URINE: 1.01 (ref 1.005–1.030)
Urobilinogen, UA: 0.2 mg/dL (ref 0.0–1.0)

## 2014-12-06 LAB — BASIC METABOLIC PANEL
Anion gap: 10 (ref 5–15)
BUN: 11 mg/dL (ref 6–20)
CHLORIDE: 101 mmol/L (ref 101–111)
CO2: 26 mmol/L (ref 22–32)
Calcium: 9 mg/dL (ref 8.9–10.3)
Creatinine, Ser: 1.21 mg/dL (ref 0.61–1.24)
GFR calc Af Amer: >60 mL/min (ref >60)
GFR calc non Af Amer: >60 mL/min (ref >60)
GLUCOSE: 130 mg/dL — AB (ref 65–99)
POTASSIUM: 2.9 mmol/L — AB (ref 3.5–5.1)
SODIUM: 137 mmol/L (ref 135–145)

## 2014-12-06 LAB — CBC
HEMATOCRIT: 45.2 % (ref 39.0–52.0)
Hemoglobin: 16.1 g/dL (ref 13.0–17.0)
MCH: 31 pg (ref 26.0–34.0)
MCHC: 35.6 g/dL (ref 30.0–36.0)
MCV: 87.1 fL (ref 78.0–100.0)
Platelets: 218 10*3/uL (ref 150–400)
RBC: 5.19 MIL/uL (ref 4.22–5.81)
RDW: 12.6 % (ref 11.5–15.5)
WBC: 7.6 10*3/uL (ref 4.0–10.5)

## 2014-12-06 LAB — MAGNESIUM: Magnesium: 1.9 mg/dL (ref 1.7–2.4)

## 2014-12-06 LAB — CBG MONITORING, ED: Glucose-Capillary: 113 mg/dL — ABNORMAL HIGH (ref 65–99)

## 2014-12-06 MED ORDER — POTASSIUM CHLORIDE CRYS ER 20 MEQ PO TBCR
40.0000 meq | EXTENDED_RELEASE_TABLET | Freq: Once | ORAL | Status: AC
  Administered 2014-12-06: 40 meq via ORAL
  Filled 2014-12-06: qty 2

## 2014-12-06 NOTE — ED Notes (Signed)
Pt to CT

## 2014-12-06 NOTE — ED Notes (Signed)
[facility] [employer] [facility] Pt was sitting in class and did not feel well, teacher in class stated pt eyes started rolling back in head, R side of body was jerking, pt does not remember any events. Pt also reported numbness and tingling, was dizzy en route. No hx of seizures. CBG 104

## 2014-12-06 NOTE — ED Notes (Signed)
EDP at bedside  

## 2014-12-06 NOTE — ED Notes (Signed)
Pt reported feeling "spacey" again. Pt laid flat and turned on side. O2 and suction is at bedside. Padded side rails in place

## 2014-12-06 NOTE — Discharge Instructions (Signed)
Continue all your regular home medications as prescribed. Please follow-up with neurology. Recommend driving restrictions/ precautions until cleared by neurology. You may follow-up with your primary care physician in the interim. Return here for any new or worsening symptoms including recurrent seizure, numbness, weakness, confusion, dizziness, changes in speech, or visual disturbance.   Seizure, Adult A seizure is abnormal electrical activity in the brain. Seizures usually last from 30 seconds to 2 minutes. There are various types of seizures. Before a seizure, you may have a warning sensation (aura) that a seizure is about to occur. An aura may include the following symptoms:   Fear or anxiety.  Nausea.  Feeling like the room is spinning (vertigo).  Vision changes, such as seeing flashing lights or spots. Common symptoms during a seizure include:  A change in attention or behavior (altered mental status).  Convulsions with rhythmic jerking movements.  Drooling.  Rapid eye movements.  Grunting.  Loss of bladder and bowel control.  Bitter taste in the mouth.  Tongue biting. After a seizure, you may feel confused and sleepy. You may also have an injury resulting from convulsions during the seizure. HOME CARE INSTRUCTIONS   If you are given medicines, take them exactly as prescribed by your health care provider.  Keep all follow-up appointments as directed by your health care provider.  Do not swim or drive or engage in risky activity during which a seizure could cause further injury to you or others until your health care provider says it is OK.  Get adequate rest.  Teach friends and family what to do if you have a seizure. They should:  Lay you on the ground to prevent a fall.  Put a cushion under your head.  Loosen any tight clothing around your neck.  Turn you on your side. If vomiting occurs, this helps keep your airway clear.  Stay with you until you  recover.  Know whether or not you need emergency care. SEEK IMMEDIATE MEDICAL CARE IF:  The seizure lasts longer than 5 minutes.  The seizure is severe or you do not wake up immediately after the seizure.  You have an altered mental status after the seizure.  You are having more frequent or worsening seizures. Someone should drive you to the emergency department or call local emergency services (911 in [place] MAKE SURE YOU:  Understand these instructions.  Will watch your condition.  Will get help right away if you are not doing well or get worse. Document Released: 04/06/2000 Document Revised: 01/28/2013 Document Reviewed: 11/19/2012 [facility] Patient Information 2015 [place], [place]. This information is not intended to replace advice given to you by your health care provider. Make sure you discuss any questions you have with your health care provider.

## 2014-12-06 NOTE — ED Provider Notes (Signed)
CSN: [id number]     Arrival date & time 12/06/14  1927 History   First MD Initiated Contact with Patient 12/06/14 2007     Chief Complaint  Patient presents with  . Seizures     (Consider location/radiation/quality/duration/timing/severity/associated sxs/prior Treatment) Patient is a 59 y.o. male presenting with seizures. The history is provided by the patient and medical records.  Seizures   This is a 59 year old male with history of hypertension, GERD, insomnia, RLS, depression, presenting to the ED following a witnessed seizure. Patient was at 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 He states he laid his head down on the desk and woke up several minutes later. Her witnesses and class and teacher, patient had generalized jerking on the right side of his body, eyes rolled and back of head. There was no bowel or bladder incontinence. Patient did not fall from the chair, no head injury. Teacher reports patient was unresponsive for approximately 4 minutes. He states upon waking again he felt somewhat "spacey", but was fully oriented to his surroundings.  Patient has no history of seizure disorder. He denies any recent medication changes. No history of alcohol or illicit drug use. Patient denies recent illness, fever, or chills.  On arrival to ED, patient states he feels fine. He denies any headache, dizziness, lightheadedness, numbness or weakness, visual disturbance, or changes in speech.  VSS.  Past Medical History  Diagnosis Date  . Hiatal hernia   . GERD (gastroesophageal reflux disease)   . Insomnia   . RLS (restless legs syndrome)   . Allergy   . Hypertension   . Thyroid disease   . Cancer     melanoma/back   Past Surgical History  Procedure Laterality Date  . Melanoma removed    . Testicle removed      RT/abdominally   Family History  Problem Relation Age of Onset  . Hypertension Father    Social  History  Substance Use Topics  . Smoking status: Never Smoker   . Smokeless tobacco: None  . Alcohol Use: No    Review of Systems  Neurological: Positive for seizures.  All other systems reviewed and are negative.     Allergies  Review of patient's allergies indicates no known allergies.  Home Medications   Prior to Admission medications   Medication Sig Start Date End Date Taking? Authorizing Provider  ALPRAZolam Duanne Moron) 1 MG tablet TAKE 1 TABLET BY MOUTH DAILY 03/25/13   Hali Marry, MD  atenolol (TENORMIN) 50 MG tablet TAKE 1 TABLET DAILY 09/18/12   Hali Marry, MD  FLUoxetine (PROZAC) 20 MG capsule TAKE ONE CAPSULE DAILY 05/23/12   Hali Marry, MD  fluticasone [facility]) 50 MCG/ACT nasal spray Place 2 sprays into the nose daily. 08/29/11 08/28/12  Jade L Breeback, PA-C  gabapentin (NEURONTIN) 300 MG capsule TAKE 1 CAPSULE (300 MG TOTAL) BY MOUTH AT BEDTIME. 06/19/12   Hali Marry, MD  ramipril (ALTACE) 5 MG capsule TAKE ONE CAPSULE DAILY 09/18/12   Hali Marry, MD  ramipril (ALTACE) 5 MG capsule TAKE ONE CAPSULE DAILY 03/21/13   Hali Marry, MD  Ranitidine HCl (RANITIDINE ACID REDUCER PO) Take by mouth.    Historical Provider, MD  simvastatin (ZOCOR) 40 MG tablet Take 1 tablet (40 mg total) by mouth every evening. 07/04/12   Hali Marry, MD   BP 119/93 mmHg  Pulse 76  Temp(Src) 97.8 F (  36.6 C) (Oral)  Resp 8  Ht 6\' 3"  (1.905 m)  Wt 230 lb (104.327 kg)  BMI 28.75 kg/m2  SpO2 97%   Physical Exam  Constitutional: He is oriented to person, place, and time. He appears well-developed and well-nourished. No distress.  HENT:  Head: Normocephalic and atraumatic.  Mouth/Throat: Oropharynx is clear and moist.  No visible signs of head trauma, no tongue laceration, dentition intact  Eyes: Conjunctivae and EOM are normal. Pupils are equal, round, and reactive to light.  Neck: Normal range of motion. Neck supple.   Cardiovascular: Normal rate, regular rhythm and normal heart sounds.   Pulmonary/Chest: Effort normal and breath sounds normal. No respiratory distress. He has no wheezes.  Abdominal: Soft. Bowel sounds are normal. There is no tenderness. There is no guarding.  Musculoskeletal: Normal range of motion.  Neurological: He is alert and oriented to person, place, and time. He displays no tremor. He displays no seizure activity.  AAOx3, answering questions appropriately; equal strength UE and LE bilaterally; CN grossly intact; moves all extremities appropriately without ataxia; no focal neuro deficits or facial asymmetry appreciated  Skin: Skin is warm and dry. He is not diaphoretic.  Psychiatric: He has a normal mood and affect.  Nursing note and vitals reviewed.   ED Course  Procedures (including critical care time) Labs Review Labs Reviewed  BASIC METABOLIC PANEL - Abnormal; Notable for the following:    Potassium 2.9 (*)    Glucose, Bld 130 (*)    All other components within normal limits  CBG MONITORING, ED - Abnormal; Notable for the following:    Glucose-Capillary 113 (*)    All other components within normal limits  CBC  URINALYSIS, ROUTINE W REFLEX MICROSCOPIC (NOT AT [facility])  MAGNESIUM    Imaging Review Ct Head Wo Contrast  12/06/2014   CLINICAL DATA:  Patient states he became very hot and dizzy while sitting and passed out. No trauma. Possible seizure. No prior history of seizures  EXAM: CT HEAD WITHOUT CONTRAST  TECHNIQUE: Contiguous axial images were obtained from the base of the skull through the vertex without intravenous contrast.  COMPARISON:  None.  FINDINGS: Ventricles normal configuration and are normal in size. 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  There are no parenchymal masses or mass effect. There is no evidence of an infarct. There are no extra-axial masses or abnormal fluid collections.  There is no intracranial hemorrhage.   Sinuses and mastoid air cells are clear.  No skull lesion.  IMPRESSION: 1. Negative exam   Electronically Signed   By: Lajean Manes M.D.   On: 12/06/2014 21:18     I, Sydne Krahl M, personally reviewed and evaluated these images and lab results as part of my medical decision-making.   EKG Interpretation   Date/Time:  Monday December 06 2014 19:35:29 EDT Ventricular Rate:  73 PR Interval:  178 QRS Duration: 89 QT Interval:  379 QTC Calculation: 418 R Axis:   153 Text Interpretation:  Sinus or ectopic atrial rhythm RVH with secondary  repolarization abnrm No previous ECGs available Confirmed by KNOTT MD,  Quillian Quince ([id number]) on 12/06/2014 7:39:46 PM      MDM   Final diagnoses:  Seizure   59 year old male here after witnessed seizure. Patient has no history of seizures. On arrival to ED patient is awake, alert, and appropriately oriented. His neurologic exam is nonfocal. He has no clinical signs of head injury, facial trauma, or tongue laceration. No apparent  loss of bowel or bladder control. EKG sinus rhythm without acute ischemic changes. Lab work remarkable for hypokalemia which was replaced here in the ED. Magnesium is within normal limits. Given this was new onset seizure, CT head was obtained which is also negative for acute findings. Patient has remained neurologically intact. Emergency department. He has had no tremors or seizure activity. He has been able to get out of bed independently and a plate with steady gait. Feel patient is appropriate for discharge. He will be given neurology follow-up for further evaluation. He was placed on driving precautions until his follow-up visit.  Discussed plan with patient, he/she acknowledged understanding and agreed with plan of care.  Return precautions given for new or worsening symptoms.   Larene Pickett, PA-C 12/06/14 [id number]  Leo Grosser, MD 12/06/14 [phone]

## 2014-12-06 NOTE — ED Notes (Signed)
CBG 113. Nurse notified.

## 2016-05-24 IMAGING — CT CT HEAD W/O CM
2 series · 16 of 30 positions shown, 20 images · non-contrast
Comparison: None.

CLINICAL DATA: Patient states he became very hot and dizzy while
sitting and passed out. No trauma. Possible seizure. No prior
history of seizures

EXAM:
CT HEAD WITHOUT CONTRAST
TECHNIQUE: Contiguous axial images were obtained from the base of the skull
through the vertex without intravenous contrast.

[Series 201: head w/o, idose (1) · axial · non-contrast · 0.49mm/px · z∈[+61,+196]mm · 13 of 33 slices shown, 17 images]
[im 3/33  brain]
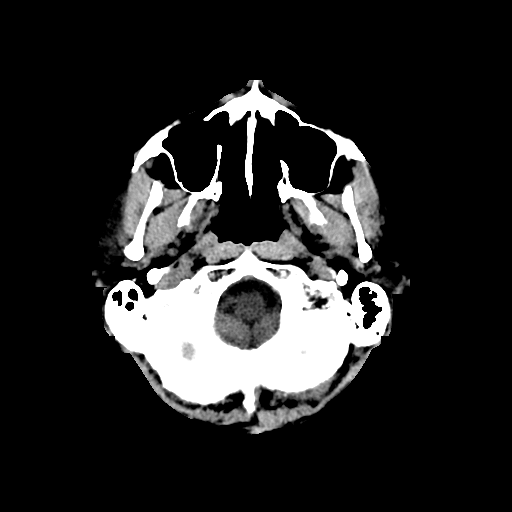
[im 3/33  bone]
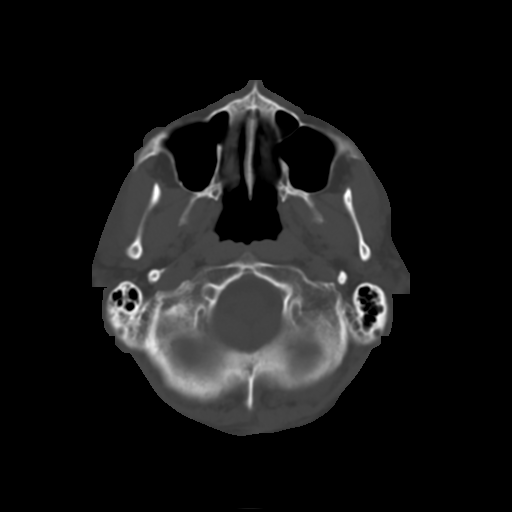
[im 5/33  brain]
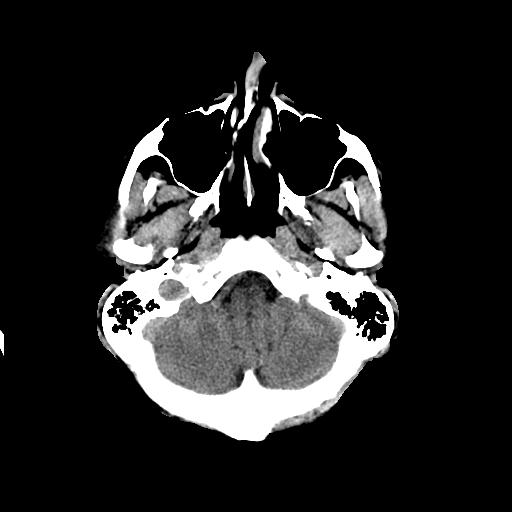
[im 7/33  brain]
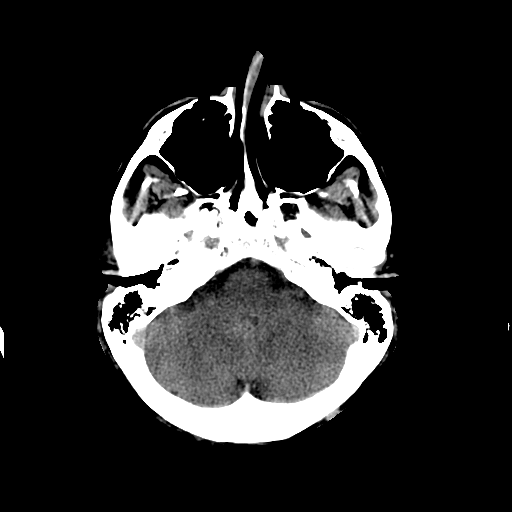
[im 10/33  brain]
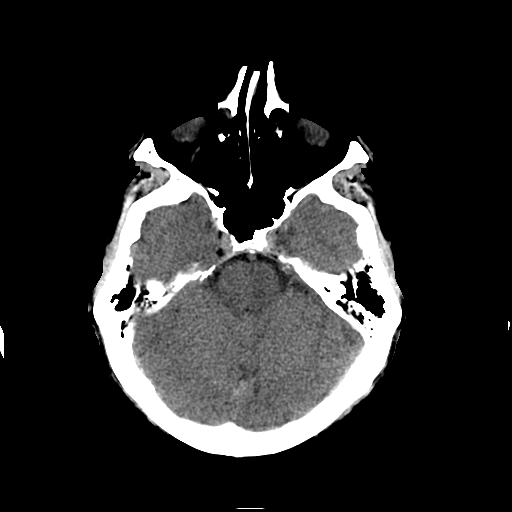
[im 12/33  brain]
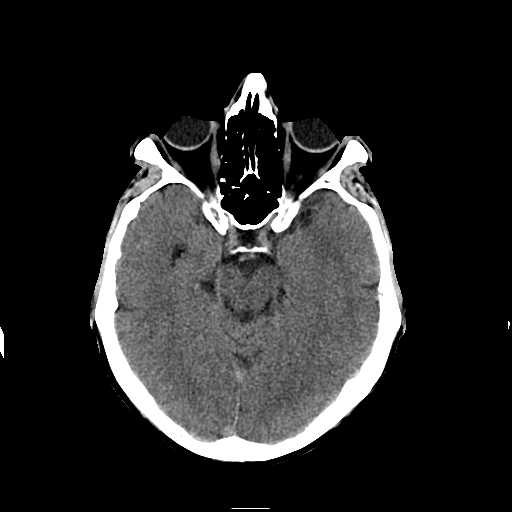
[im 12/33  bone]
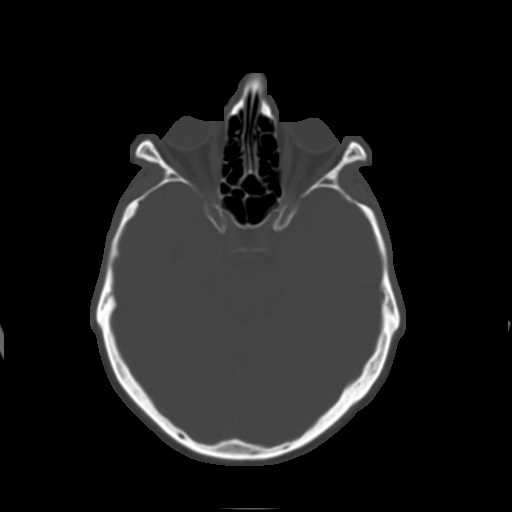
[im 14/33  brain]
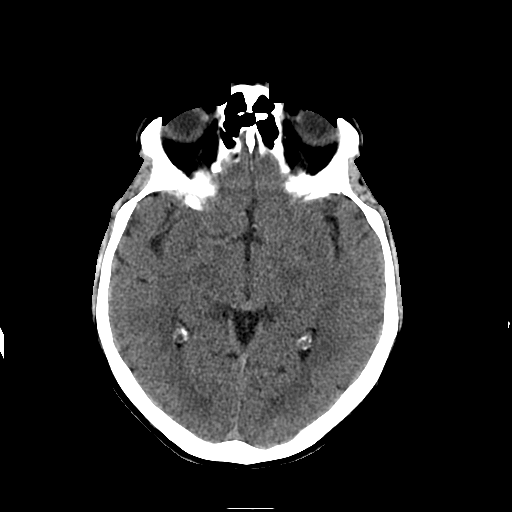
[im 17/33  brain]
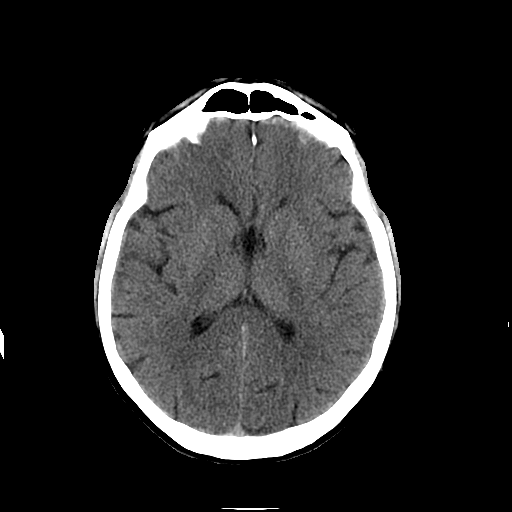
[im 19/33  brain]
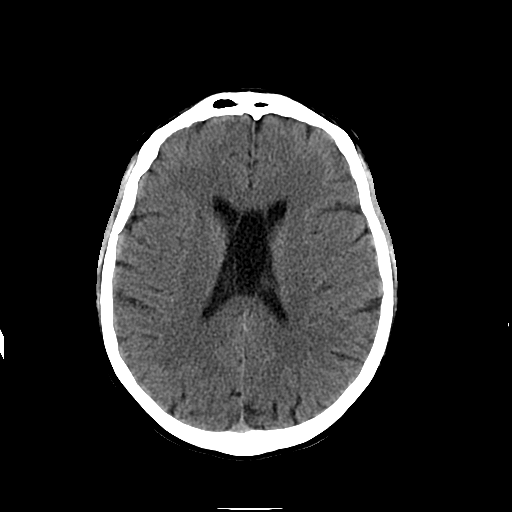
[im 21/33  brain]
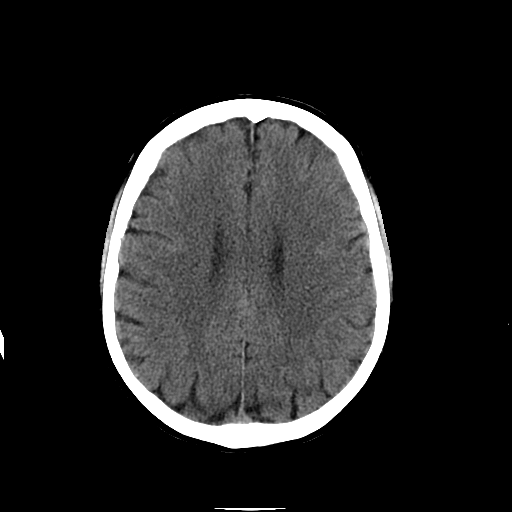
[im 21/33  bone]
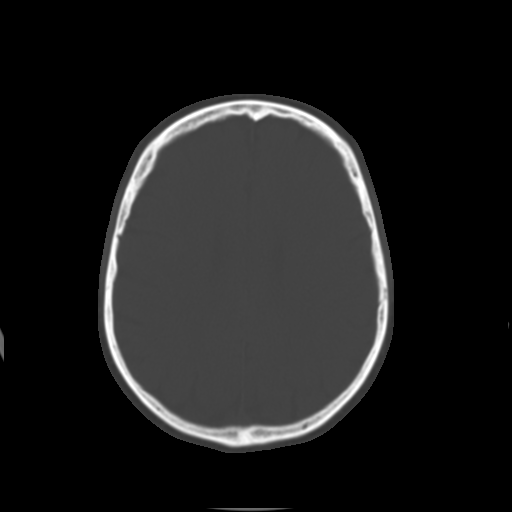
[im 23/33  brain]
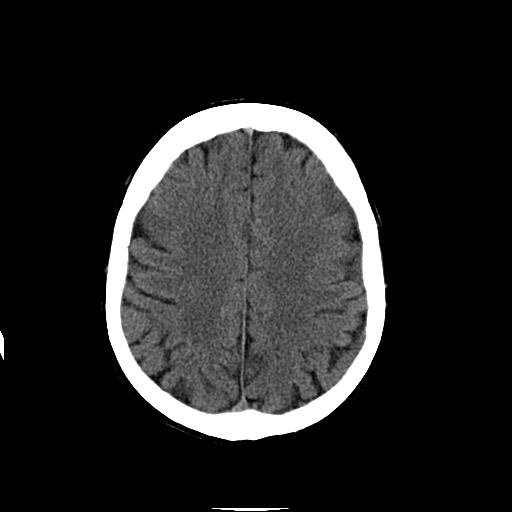
[im 26/33  brain]
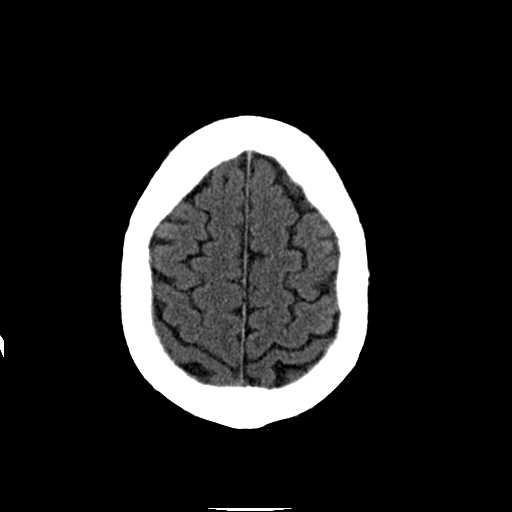
[im 28/33  brain]
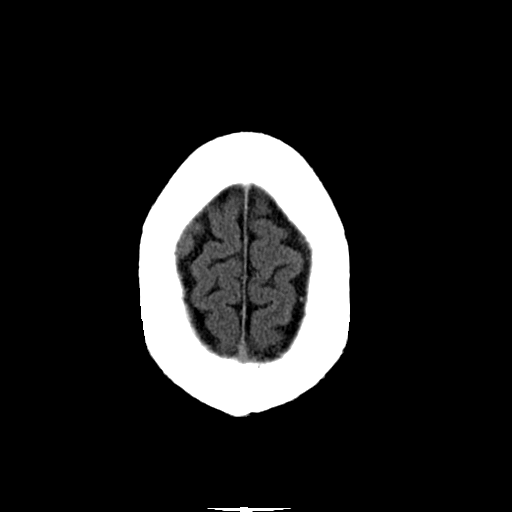
[im 30/33  brain]
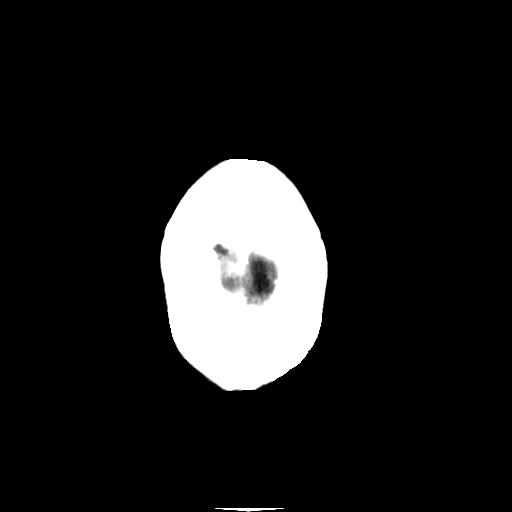
[im 30/33  bone]
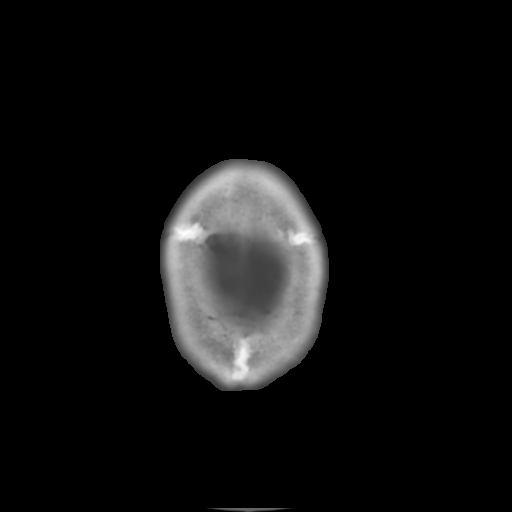

[Series 202: head w/o bone, idose (1) · axial · non-contrast · 0.49mm/px · z∈[+61,+106]mm · 3 of 33 slices shown]
[im 3/33  bone]
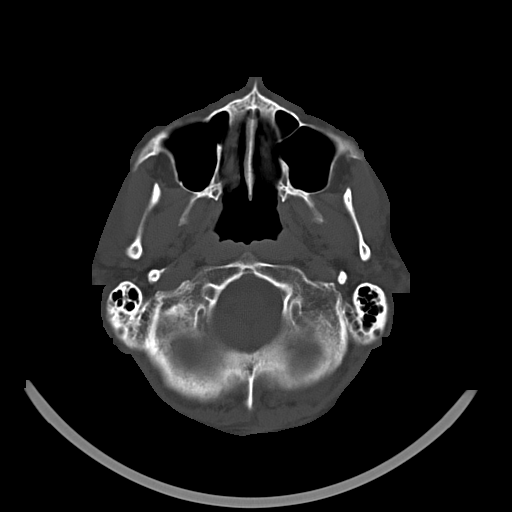
[im 7/33  bone]
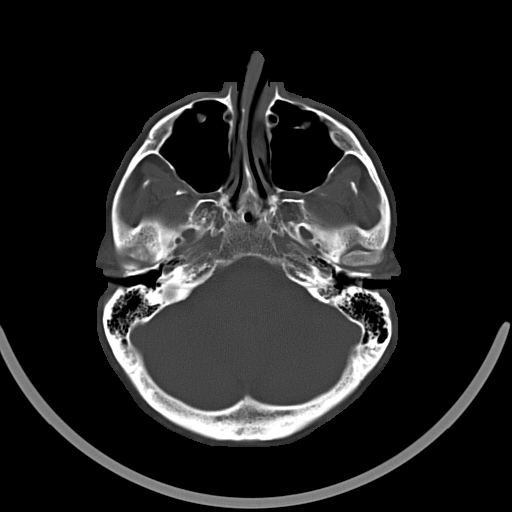
[im 12/33  bone]
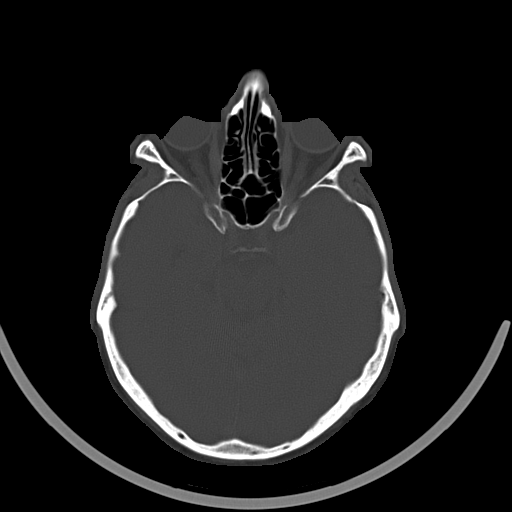

[16 of 30 positions shown; findings below may reference images not displayed]

FINDINGS: Ventricles normal configuration and are normal in size. There is a
cavum septum pellucidum, an incidental developmental anomaly.

There are no parenchymal masses or mass effect. There is no evidence
of an infarct. There are no extra-axial masses or abnormal fluid
collections.

There is no intracranial hemorrhage.

Sinuses and mastoid air cells are clear.  No skull lesion.
IMPRESSION: 1. Negative exam
